# Patient Record
Sex: Male | Born: 1935 | Race: White | Hispanic: No | Marital: Married | Smoking: Never smoker
Health system: Southern US, Community
[De-identification: ages and names within clinical notes are randomized; demographics above are authoritative.]

## PROBLEM LIST (undated history)

## (undated) DIAGNOSIS — E079 Disorder of thyroid, unspecified: Secondary | ICD-10-CM

## (undated) DIAGNOSIS — F0390 Unspecified dementia without behavioral disturbance: Secondary | ICD-10-CM

---

## 2016-12-17 ENCOUNTER — Emergency Department (HOSPITAL_COMMUNITY)
Admission: EM | Admit: 2016-12-17 | Discharge: 2016-12-17 | Disposition: A | Discharge status: Home / Self Care | Payer: Medicare Other | Attending: Emergency Medicine | Admitting: Emergency Medicine

## 2016-12-17 DIAGNOSIS — R55 Syncope and collapse: Secondary | ICD-10-CM | POA: Insufficient documentation

## 2016-12-17 DIAGNOSIS — Z7982 Long term (current) use of aspirin: Secondary | ICD-10-CM | POA: Insufficient documentation

## 2016-12-17 LAB — BASIC METABOLIC PANEL
ANION GAP: 5 (ref 5–15)
BUN: 22 mg/dL — ABNORMAL HIGH (ref 6–20)
CHLORIDE: 108 mmol/L (ref 101–111)
CO2: 26 mmol/L (ref 22–32)
Calcium: 8.7 mg/dL — ABNORMAL LOW (ref 8.9–10.3)
Creatinine, Ser: 1.21 mg/dL (ref 0.61–1.24)
GFR calc Af Amer: >60 mL/min (ref >60)
GFR calc non Af Amer: 52 mL/min — ABNORMAL LOW (ref >60)
GLUCOSE: 87 mg/dL (ref 65–99)
POTASSIUM: 4.1 mmol/L (ref 3.5–5.1)
Sodium: 139 mmol/L (ref 135–145)

## 2016-12-17 LAB — CBC WITH DIFFERENTIAL/PLATELET
BASOS ABS: 0 10*3/uL (ref 0.0–0.1)
Basophils Relative: 0 %
Eosinophils Absolute: 0.1 10*3/uL (ref 0.0–0.7)
Eosinophils Relative: 1 %
HEMATOCRIT: 36.3 % — AB (ref 39.0–52.0)
Hemoglobin: 12.4 g/dL — ABNORMAL LOW (ref 13.0–17.0)
LYMPHS ABS: 2.3 10*3/uL (ref 0.7–4.0)
LYMPHS PCT: 19 %
MCH: 33 pg (ref 26.0–34.0)
MCHC: 34.2 g/dL (ref 30.0–36.0)
MCV: 96.5 fL (ref 78.0–100.0)
Monocytes Absolute: 0.7 10*3/uL (ref 0.1–1.0)
Monocytes Relative: 6 %
NEUTROS ABS: 8.6 10*3/uL — AB (ref 1.7–7.7)
Neutrophils Relative %: 74 %
Platelets: 152 10*3/uL (ref 150–400)
RBC: 3.76 MIL/uL — AB (ref 4.22–5.81)
RDW: 13.1 % (ref 11.5–15.5)
WBC: 11.8 10*3/uL — AB (ref 4.0–10.5)

## 2016-12-17 LAB — I-STAT TROPONIN, ED: Troponin i, poc: 0 ng/mL (ref 0.00–0.08)

## 2016-12-17 NOTE — ED Triage Notes (Addendum)
Pt arrived via [facility] from home after experiencing a near syncopal episode after showering. Pt was found by wife leaning against shower wall calling for help. Pt denies LOC in shower but experienced dizziness. Pt family stated pt passed out after they pulled pt from shower. Initial EMS BP was 86/42. NS was given on scene. Pt has hx of dementia. Pt alert but unable to tell RN what hospital he is at. Daughter and pt wife at bedside. CBG 205 on scene.

## 2016-12-17 NOTE — Discharge Instructions (Signed)
It was my pleasure taking care of you today!  Increase hydration.  Follow up with your primary care provider for further discussion of today's hospital visit.  Return to ER for new or worsening symptoms, any additional concerns.

## 2016-12-17 NOTE — ED Provider Notes (Signed)
[facility]-EMERGENCY DEPT Provider Note   CSN: [id number] Arrival date & time: 12/17/16  1320     History   Chief Complaint Chief Complaint  Patient presents with  . Near Syncope    HPI Ross Barron is a 83 y.o. male.  The history is provided by the patient, a relative, a caregiver and medical records. No language interpreter was used.  Near Syncope    Ross Barron is a 83 y.o. male  with a PMH of dementia and thyroid disorder who presents to the Emergency Department with wife and daughter for evaluation following syncopal episode just prior to arrival. Wife states that Ross Barron was taking a hot shower for quite some time, then called out for help. He was found in the shower, propped up against the wall. He looked weak and wife helped him to a seated position right outside the shower. She notes he was very pale and his legs were "shaking like they were going to give out". Upon getting seated, his head leaned forward and he "passed out", not responding for around 3-4 minutes, prompting them to call 911. After 3-4 minutes, patient returned to usual mental status. EMS arrived and initial BP was 86/42. 500 mL normal saline were given prior to arrival. No other medications prior to arrival for symptoms. BP upon arrival was 146/61. CBG of 205 by EMS. No change in medications recently. Wife does note that she has had 3-4 episodes that were quite similar, always after a long warm shower, over the last several months. She notes that today, he did not respond for several minutes, however typically, he will come to after 15-30 seconds. They have never sought medical care following these events. The daughter notes that they were out in the sun for a very long time yesterday and he didn't drink much water. She kept offering him water multiple times throughout the day, however he kept saying he was fine. No known cardiac PMH.   Level V caveat applies 2/2 dementia. He does not remember today's  episode and unable to provide much to history.   No past medical history on file.  There are no active problems to display for this patient.   No past surgical history on file.     Home Medications    Prior to Admission medications   Medication Sig Start Date End Date Taking? Authorizing Provider  aspirin EC 81 MG tablet Take 81 mg by mouth daily.   Yes [provider]  donepezil (ARICEPT) 10 MG tablet Take 10 mg by mouth at bedtime.   Yes [provider]  levothyroxine (SYNTHROID, LEVOTHROID) 50 MCG tablet Take 50 mcg by mouth daily before breakfast.   Yes [provider]  memantine (NAMENDA) 10 MG tablet Take 10 mg by mouth 2 (two) times daily.   Yes [provider]  Multiple Vitamin (MULTIVITAMIN WITH MINERALS) TABS tablet Take 1 tablet by mouth every morning.   Yes [provider]    Family History No family history on file.  Social History Social History  Substance Use Topics  . Smoking status: Not on file  . Smokeless tobacco: Not on file  . Alcohol use Not on file     Allergies   Patient has no known allergies.   Review of Systems Review of Systems  Unable to perform ROS: Dementia  Cardiovascular: Positive for near-syncope.  Neurological: Positive for syncope.     Physical Exam Updated Vital Signs BP (!) 144/72   Pulse Marland Kitchen(!)  56   Temp 97.5 F (36.4 C) (Oral)   Resp (!) 25   SpO2 98%   Physical Exam  Constitutional: He is oriented to person, place, and time. He appears well-developed and well-nourished. No distress.  HENT:  Head: Normocephalic and atraumatic.  Neck: No JVD present.  Cardiovascular: Normal heart sounds.   No murmur heard. Mildly bradycardic but regular.  Pulmonary/Chest: Effort normal and breath sounds normal. No respiratory distress. He has no wheezes. He has no rales.  Abdominal: Soft. He exhibits no distension. There is no tenderness.  Musculoskeletal: He exhibits no edema.    Neurological: He is alert and oriented to person, place, and time. No cranial nerve deficit.  Skin: Skin is warm and dry.  Nursing note and vitals reviewed.    ED Treatments / Results  Labs (all labs ordered are listed, but only abnormal results are displayed) Labs Reviewed  CBC WITH DIFFERENTIAL/PLATELET - Abnormal; Notable for the following:       Result Value   WBC 11.8 (*)    RBC 3.76 (*)    Hemoglobin 12.4 (*)    HCT 36.3 (*)    Neutro Abs 8.6 (*)    All other components within normal limits  BASIC METABOLIC PANEL - Abnormal; Notable for the following:    BUN 22 (*)    Calcium 8.7 (*)    GFR calc non Af Amer 52 (*)    All other components within normal limits  I-STAT TROPOININ, ED    EKG  EKG Interpretation  Date/Time:  Monday December 17 2016 15:26:47 EDT Ventricular Rate:  57 PR Interval:    QRS Duration: 93 QT Interval:  456 QTC Calculation: 444 R Axis:   21 Text Interpretation:  Sinus rhythm Abnormal R-wave progression, early transition No significant change since last tracing Confirmed by Doug Sou [phone]) on 12/17/2016 3:30:45 PM Also confirmed by Doug Sou [phone]), editor Misty Stanley [phone])  on 12/17/2016 3:40:04 PM       Radiology No results found.  Procedures Procedures (including critical care time)  Medications Ordered in ED Medications - No data to display   Initial Impression / Assessment and Plan / ED Course  I have reviewed the triage vital signs and the nursing notes.  Pertinent labs & imaging results that were available during my care of the patient were reviewed by me and considered in my medical decision making (see chart for details).    Ross Barron is a 83 y.o. male who presents to ED for syncopal event while in the shower just prior to arrival. Patient apparently outside in heat yesterday with little PO intake today. Near syncope while taking a warm shower and had witnessed syncopal event where head fell  forward right after being sat down. No seizure-like activity or hx of seizures. Hypotensive prior to arrival 86/42 - 500cc bolus given and BP 146/61 upon arrival.  Upon initial evaluation, patient is afebrile, hemodynamically stable with no complaints. Family state he appears at his baseline health currently. Labs reviewed and reassuring. Troponin negative. EKG non-ischemic. Evaluation does not show pathology that would require ongoing emergent intervention or inpatient treatment. Patient's family understands importance of following up with PCP for further discussion of today's incident and recurring near-syncopal events. Reasons to return to ER were discussed and all questions answered.   Patient seen by and discussed with Dr. Verdie Mosher who agrees with treatment plan.    Final Clinical Impressions(s) / ED Diagnoses   Final diagnoses:  Syncope, unspecified  syncope type    New Prescriptions New Prescriptions   No medications on file     Diamantina Edinger, Chase Picket, PA-C 12/17/16 1710    Lavera Guise, MD 12/17/16 [phone]

## 2016-12-17 NOTE — ED Provider Notes (Signed)
Medical screening examination/treatment/procedure(s) were conducted as a shared visit with non-physician practitioner(s) and myself.  I personally evaluated the patient during the encounter.   EKG Interpretation  Date/Time:  Monday December 17 2016 15:26:47 EDT Ventricular Rate:  57 PR Interval:    QRS Duration: 93 QT Interval:  456 QTC Calculation: 444 R Axis:   21 Text Interpretation:  Sinus rhythm Abnormal R-wave progression, early transition No significant change since last tracing Confirmed by Doug SouJacubowitz, Sam [phone]([id number]) on 12/17/2016 3:30:45 PM Also confirmed by Doug SouJacubowitz, Sam [phone]), editor Misty StanleyScales-Price, Shannon [phone])  on 12/17/2016 3:40:6804 PM      83 year old male with history of dementia who presents with syncope. Patient taking hot shower, when he felt unwell and called out to wife. Family sat him down on side of bathtub and he became unresponsive for 3 minutes before waking and returning to normal mental status. He has had syncope before, all in the setting of taking prolonged hot showers. Denies associated chest pain, back pain, abdominal pain, n/v/d, Gi bleeding, fever or chills. Was orthostatic for EMS and given 500 cc IVF. Feels normal in ED.   Patient with normotension and stable vital signs. Ekg unchanged from prior and without acute stigmata of arrhythmia. Blood work stable, with stable mild anemia 12.4. No major electrolyte or metabolic derangements. Suspect that given only occurring with hot showers, that he vasodilates, has pooling of blood and potential orthostasic hypotension and syncope. Given age discussed potential observation in hospital, but patient and family expressed desire for discharge home with close PCP follow-up. They state he has had work-up with ECHO and with cardiology follow-up in the past that have been reassuring. Discussed strict return and follow-up instructions. Patient and family expressed understanding of all discharge instructions and felt comfortable with  plan of care.   Lavera GuiseLiu, Marlyce Mcdougald Duo, MD 12/17/16 2245

## 2017-08-05 ENCOUNTER — Ambulatory Visit (INDEPENDENT_AMBULATORY_CARE_PROVIDER_SITE_OTHER): Payer: Non-veteran care | Admitting: Podiatry

## 2017-08-05 ENCOUNTER — Encounter: Payer: Self-pay | Admitting: Podiatry

## 2017-08-05 VITALS — BP 142/59 | HR 56

## 2017-08-05 DIAGNOSIS — B351 Tinea unguium: Secondary | ICD-10-CM | POA: Diagnosis not present

## 2017-08-05 DIAGNOSIS — M79672 Pain in left foot: Secondary | ICD-10-CM

## 2017-08-05 DIAGNOSIS — M79671 Pain in right foot: Secondary | ICD-10-CM | POA: Diagnosis not present

## 2017-08-05 NOTE — Progress Notes (Signed)
SUBJECTIVE: 84 y.o. year old male presents requesting toe nails trimmed. Having difficulty trimming nails. They are too thick and painful. Patient was accompanied by his wife. Patient is hard of hearing.   Review of Systems  Constitutional: Negative.   HENT: Negative for ear discharge, ear pain and tinnitus.        Difficulty hearing with hearing aid on.   Eyes: Negative.   Respiratory: Negative.   Cardiovascular: Negative.   Genitourinary: Negative.   Musculoskeletal: Negative.   Skin: Negative.      OBJECTIVE: DERMATOLOGIC EXAMINATION: Thick yellow dystrophic nails x 10.  VASCULAR EXAMINATION OF LOWER LIMBS: Pedal pulses are not palpable on DP and palpable on PT bilateral. Capillary Filling times within 3 seconds in all digits.  No edema or erythema noted. Temperature gradient from tibial crest to dorsum of foot is within normal bilateral.  NEUROLOGIC EXAMINATION OF THE LOWER LIMBS: Achilles DTR is present and within normal. Monofilament (Semmes-Weinstein 10-gm) sensory testing positive 6 out of 6, bilateral. Vibratory sensations(128Hz  turning fork) intact at medial and lateral forefoot bilateral.  Sharp and Dull discriminatory sensations at the plantar ball of hallux is intact bilateral.   MUSCULOSKELETAL EXAMINATION: No gross deformities.   ASSESSMENT: Onychomycosis x 10. Painful nails.  PLAN: Reviewed findings and available treatment options. All nails debrided. May return in 3 months.

## 2017-08-05 NOTE — Patient Instructions (Signed)
Seen for hypertrophic nails. All nails debrided. Return in 3 months or sooner if needed.  

## 2017-10-12 ENCOUNTER — Encounter (HOSPITAL_COMMUNITY): Payer: Self-pay

## 2017-10-12 ENCOUNTER — Emergency Department (HOSPITAL_COMMUNITY)
Admission: EM | Admit: 2017-10-12 | Discharge: 2017-10-12 | Disposition: A | Discharge status: Home / Self Care | Payer: Medicare Other | Attending: Emergency Medicine | Admitting: Emergency Medicine

## 2017-10-12 ENCOUNTER — Emergency Department (HOSPITAL_COMMUNITY): Payer: Medicare Other

## 2017-10-12 DIAGNOSIS — R531 Weakness: Secondary | ICD-10-CM | POA: Diagnosis not present

## 2017-10-12 DIAGNOSIS — Z79899 Other long term (current) drug therapy: Secondary | ICD-10-CM | POA: Insufficient documentation

## 2017-10-12 DIAGNOSIS — R55 Syncope and collapse: Secondary | ICD-10-CM | POA: Diagnosis not present

## 2017-10-12 DIAGNOSIS — Z7982 Long term (current) use of aspirin: Secondary | ICD-10-CM | POA: Insufficient documentation

## 2017-10-12 DIAGNOSIS — F0390 Unspecified dementia without behavioral disturbance: Secondary | ICD-10-CM | POA: Diagnosis not present

## 2017-10-12 DIAGNOSIS — R001 Bradycardia, unspecified: Secondary | ICD-10-CM | POA: Diagnosis not present

## 2017-10-12 HISTORY — DX: Disorder of thyroid, unspecified: E07.9

## 2017-10-12 HISTORY — DX: Unspecified dementia, unspecified severity, without behavioral disturbance, psychotic disturbance, mood disturbance, and anxiety: F03.90

## 2017-10-12 LAB — BASIC METABOLIC PANEL
ANION GAP: 10 (ref 5–15)
BUN: 27 mg/dL — ABNORMAL HIGH (ref 6–20)
CALCIUM: 9.2 mg/dL (ref 8.9–10.3)
CO2: 23 mmol/L (ref 22–32)
Chloride: 107 mmol/L (ref 101–111)
Creatinine, Ser: 1.15 mg/dL (ref 0.61–1.24)
GFR, EST NON AFRICAN AMERICAN: 55 mL/min — AB (ref >60)
Glucose, Bld: 92 mg/dL (ref 65–99)
Potassium: 4.1 mmol/L (ref 3.5–5.1)
SODIUM: 140 mmol/L (ref 135–145)

## 2017-10-12 LAB — CBC
HCT: 40.4 % (ref 39.0–52.0)
HEMOGLOBIN: 13.5 g/dL (ref 13.0–17.0)
MCH: 32.5 pg (ref 26.0–34.0)
MCHC: 33.4 g/dL (ref 30.0–36.0)
MCV: 97.1 fL (ref 78.0–100.0)
Platelets: 152 10*3/uL (ref 150–400)
RBC: 4.16 MIL/uL — AB (ref 4.22–5.81)
RDW: 13.5 % (ref 11.5–15.5)
WBC: 8.9 10*3/uL (ref 4.0–10.5)

## 2017-10-12 MED ORDER — SODIUM CHLORIDE 0.9 % IV BOLUS
500.0000 mL | Freq: Once | INTRAVENOUS | Status: AC
  Administered 2017-10-12: 500 mL via INTRAVENOUS

## 2017-10-12 NOTE — ED Provider Notes (Signed)
[facility]   CSN: [id number] Arrival date & time: 10/12/17  0134     History   Chief Complaint Chief Complaint  Patient presents with  . Weakness    HPI Ross Barron is a 84 y.o. male.  The history is provided by the patient and medical records.  Weakness      84 year old male with history of dementia and thyroid disease, presenting to the ED with generalized weakness.  Wife states he has been complaining of feeling "terrible" all day.  States got worse this evening around 5 PM.  States he was sitting at the table and stated "I am going to die".  Wife states he has not really had any significant symptoms other than a small amount of diarrhea a few days ago.  He has not had any nausea or vomiting.  His appetite has been poor and he refuses to drink fluids.  It apparently has been an ongoing battle trying to get him to drink fluids during the day.  He denies any chest pain, SOB, abdominal pain, nausea, vomiting.  States he feels weak all over.  No focal numbness/weakness of the arms or legs.  States he does feel lightheaded when he stands up sometimes.  He does not use cane or walker.  Family reports hx of syncopal events in the past secondary to similar symptoms.  No syncopal events or falls in the past 24 hours.  Wife reports they did increase his aricept from 10mg  to 15mg  recently, no other medication changes.  Past Medical History:  Diagnosis Date  . Dementia   . Thyroid disease     There are no active problems to display for this patient.   History reviewed. No pertinent surgical history.      Home Medications    Prior to Admission medications   Medication Sig Start Date End Date Taking? Authorizing Provider  aspirin EC 81 MG tablet Take 81 mg by mouth daily.    [provider]  donepezil (ARICEPT) 10 MG tablet Take 10 mg by mouth at bedtime.    [provider]  levothyroxine (SYNTHROID,  LEVOTHROID) 50 MCG tablet Take 50 mcg by mouth daily before breakfast.    [provider]  memantine (NAMENDA) 10 MG tablet Take 10 mg by mouth 2 (two) times daily.    [provider]  Multiple Vitamin (MULTIVITAMIN WITH MINERALS) TABS tablet Take 1 tablet by mouth every morning.    [provider]    Family History No family history on file.  Social History Social History   Tobacco Use  . Smoking status: Never Smoker  . Smokeless tobacco: Never Used  Substance Use Topics  . Alcohol use: Not on file  . Drug use: Not on file     Allergies   Patient has no known allergies.   Review of Systems Review of Systems  Neurological: Positive for weakness (generalized).  All other systems reviewed and are negative.    Physical Exam Updated Vital Signs BP (!) 148/65   Pulse (!) 51   Temp 98.2 F (36.8 C) (Rectal)   Resp (!) 23   Ht 5\' 3"  (1.6 m)   Wt 49.9 kg (110 lb)   SpO2 97%   BMI 19.49 kg/m   Physical Exam  Constitutional: He is oriented to person, place, and time. He appears well-developed and well-nourished.  Elderly, thin but not cachectic, NAD  HENT:  Head: Normocephalic and atraumatic.  Mouth/Throat:  Oropharynx is clear and moist.  Dry mucous membranes  Eyes: Pupils are equal, round, and reactive to light. Conjunctivae and EOM are normal.  Neck: Normal range of motion.  Cardiovascular: Normal rate, regular rhythm and normal heart sounds.  Pulmonary/Chest: Effort normal and breath sounds normal.  Abdominal: Soft. Bowel sounds are normal.  Musculoskeletal: Normal range of motion.  Neurological: He is alert and oriented to person, place, and time.  AAOx3, answering questions and following commands appropriately; equal strength UE and LE bilaterally; CN grossly intact; moves all extremities appropriately without ataxia; no focal neuro deficits or facial asymmetry appreciated  Skin: Skin is warm and dry.  Psychiatric: He has a normal mood  and affect.  Nursing note and vitals reviewed.    ED Treatments / Results  Labs (all labs ordered are listed, but only abnormal results are displayed) Labs Reviewed  BASIC METABOLIC PANEL - Abnormal; Notable for the following components:      Result Value   BUN 27 (*)    GFR calc non Af Amer 55 (*)    All other components within normal limits  CBC - Abnormal; Notable for the following components:   RBC 4.16 (*)    All other components within normal limits  URINALYSIS, ROUTINE W REFLEX MICROSCOPIC  CBG MONITORING, ED    EKG EKG Interpretation  Date/Time:  Saturday October 12 2017 01:40:35 EDT Ventricular Rate:  57 PR Interval:    QRS Duration: 95 QT Interval:  482 QTC Calculation: 470 R Axis:   51 Text Interpretation:  Sinus rhythm Abnormal R-wave progression, early transition No acute changes Nonspecific ST and T wave abnormality Confirmed by Derwood Kaplan ([id number]) on 10/12/2017 4:50:58 AM   Radiology Dg Chest 2 View  Result Date: 10/12/2017 CLINICAL DATA:  84 year old male with generalized weakness. EXAM: CHEST - 2 VIEW COMPARISON:  None. FINDINGS: Shallow inspiration with minimal bibasilar atelectasis. Right apical pleural thickening/scarring. Calcified pleural plaques. No focal consolidation, pleural effusion, or pneumothorax. The cardiac silhouette is within normal limits. No acute osseous pathology. IMPRESSION: No active cardiopulmonary disease. Electronically Signed   By: Elgie Collard M.D.   On: 10/12/2017 04:03    Procedures Procedures (including critical care time)  Medications Ordered in ED Medications  sodium chloride 0.9 % bolus 500 mL (0 mLs Intravenous Stopped 10/12/17 0519)     Initial Impression / Assessment and Plan / ED Course  I have reviewed the triage vital signs and the nursing notes.  Pertinent labs & imaging results that were available during my care of the patient were reviewed by me and considered in my medical decision making (see chart  for details).  84 year old male presenting to the ED with generalized weakness.  This began later this evening.  Patient was apparently sitting at the table with wife stating "he felt terrible" and that "he might die".  He has not had any chest pain, shortness of breath, dizziness, numbness, focal weakness, abdominal pain, nausea, or vomiting.  Wife states he did have some diarrhea a few days ago.  He is awake, alert, appropriately oriented here.  His vital signs are stable, heart rate is on the lower side.  No focal neurologic deficits.  Still denies any pain.  EKG is nonischemic.  Screening labs overall reassuring--BUN is elevated at 27.  Family reports patient has ongoing issue with drinking fluids at home as most of the time he refuses.  Suspect some of his symptoms may be related to dehydration.  He will given small  fluid bolus.  Will add on urinalysis and chest x-ray.    Chest x-ray is negative.  Patient feeling better after IV fluids.  He did drink some water here as well.  Family is happy with his improvement.  Still awaiting UA, however family elected to take patient home.  No hx of UTI, no current urinary symptoms.  Feel this is reasonable.  They will follow-up closely with primary care doctor.  Discussed continued oral hydration at home.  They understand to return here for any new/acute changes.    Final Clinical Impressions(s) / ED Diagnoses   Final diagnoses:  Generalized weakness  Vasovagal attack  Bradycardia    ED Discharge Orders    None       Garlon Hatchet, PA-C 10/12/17 0600    Derwood Kaplan, MD 10/15/17 2142

## 2017-10-12 NOTE — ED Notes (Signed)
ED Provider at bedside. 

## 2017-10-12 NOTE — Discharge Instructions (Addendum)
All the results in the ER are normal, labs and imaging. Our monitoring revealed that you have been having heart rate in the 40s, otherwise no abnormalities detected. We are not sure what is causing your symptoms.  We suspect that this could be orthostasis, and recommend that you drink appropriate amount of fluids.  The workup in the ER is not complete, and is limited to screening for life threatening and emergent conditions only, so please see a primary care doctor for further evaluation.  Please keep a close eye on how Ross Barron is doing over the next couple of days. If he starts developing any symptoms of infection or is getting worse please return to the ER immediately.

## 2017-10-12 NOTE — ED Triage Notes (Signed)
Pt from home c/o generalized weakness and decreased appetite that has been going on all day. Pt has hx of dementia. VSS, nad. Pt alert. CBG 116

## 2017-11-04 ENCOUNTER — Ambulatory Visit (INDEPENDENT_AMBULATORY_CARE_PROVIDER_SITE_OTHER): Payer: Non-veteran care | Admitting: Podiatry

## 2017-11-04 ENCOUNTER — Encounter: Payer: Self-pay | Admitting: Podiatry

## 2017-11-04 DIAGNOSIS — M79672 Pain in left foot: Secondary | ICD-10-CM

## 2017-11-04 DIAGNOSIS — B351 Tinea unguium: Secondary | ICD-10-CM

## 2017-11-04 DIAGNOSIS — M79671 Pain in right foot: Secondary | ICD-10-CM | POA: Diagnosis not present

## 2017-11-04 NOTE — Patient Instructions (Signed)
Seen for hypertrophic nails. All nails debrided. Return in 3 months or as needed.  

## 2017-11-04 NOTE — Progress Notes (Signed)
Subjective: 84 y.o. year old male patient accompanied by his wife presents complaining of painful nails. Patient requests toe nails trimmed.   Objective: Dermatologic: Thick yellow deformed nails with fungal debris under nail plate x 10. Vascular: Pedal pulses are all palpable. Orthopedic: Contracted lesser digits with bunion deformities. Neurologic: All epicritic and tactile sensations grossly intact.  Assessment: Dystrophic mycotic nails x 10. Painful feet.  Treatment: All mycotic nails debrided.  Return in 3 months or as needed.

## 2018-02-03 ENCOUNTER — Ambulatory Visit: Payer: Self-pay | Admitting: Podiatry

## 2020-02-05 IMAGING — CR DG CHEST 2V
2 series · 2 of 2 positions shown · non-contrast
Comparison: None.

CLINICAL DATA: 89-year-old male with generalized weakness.

EXAM:
CHEST - 2 VIEW

[chest lat]
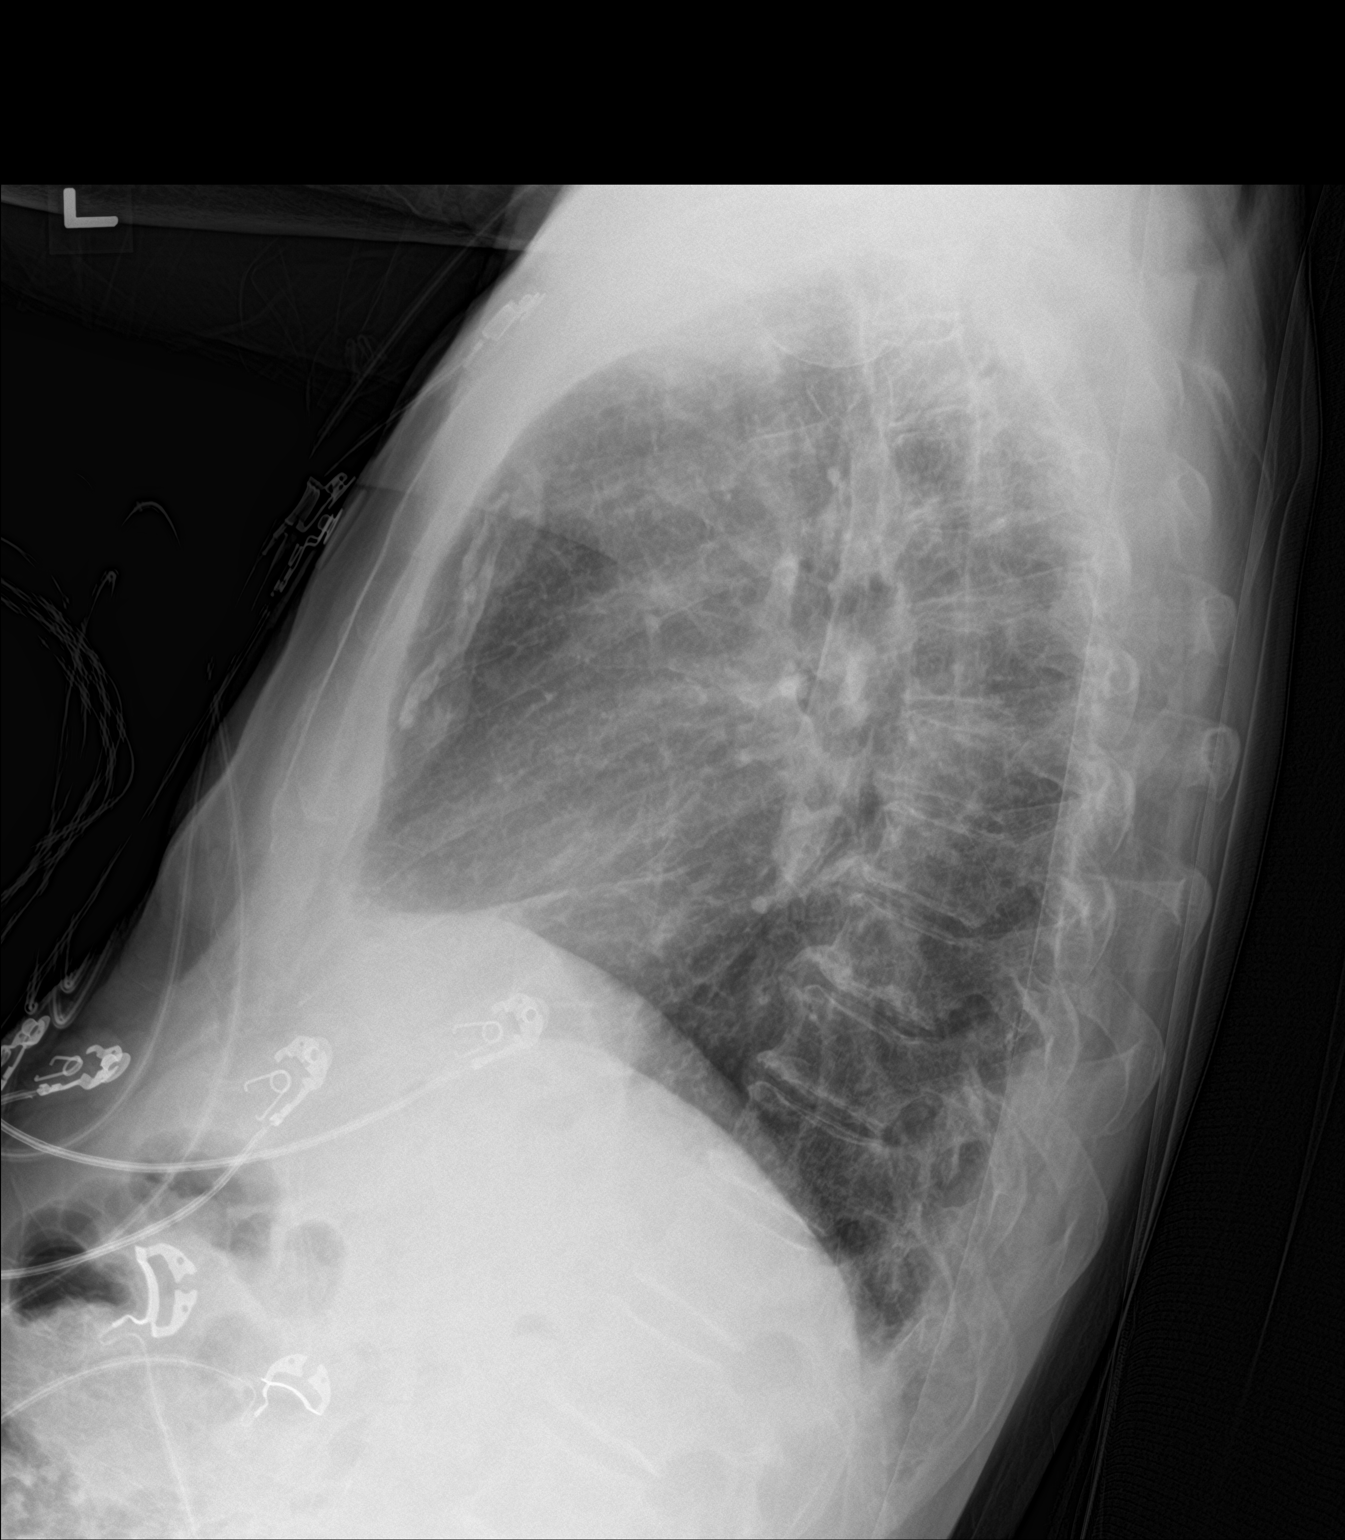

[chest ap]
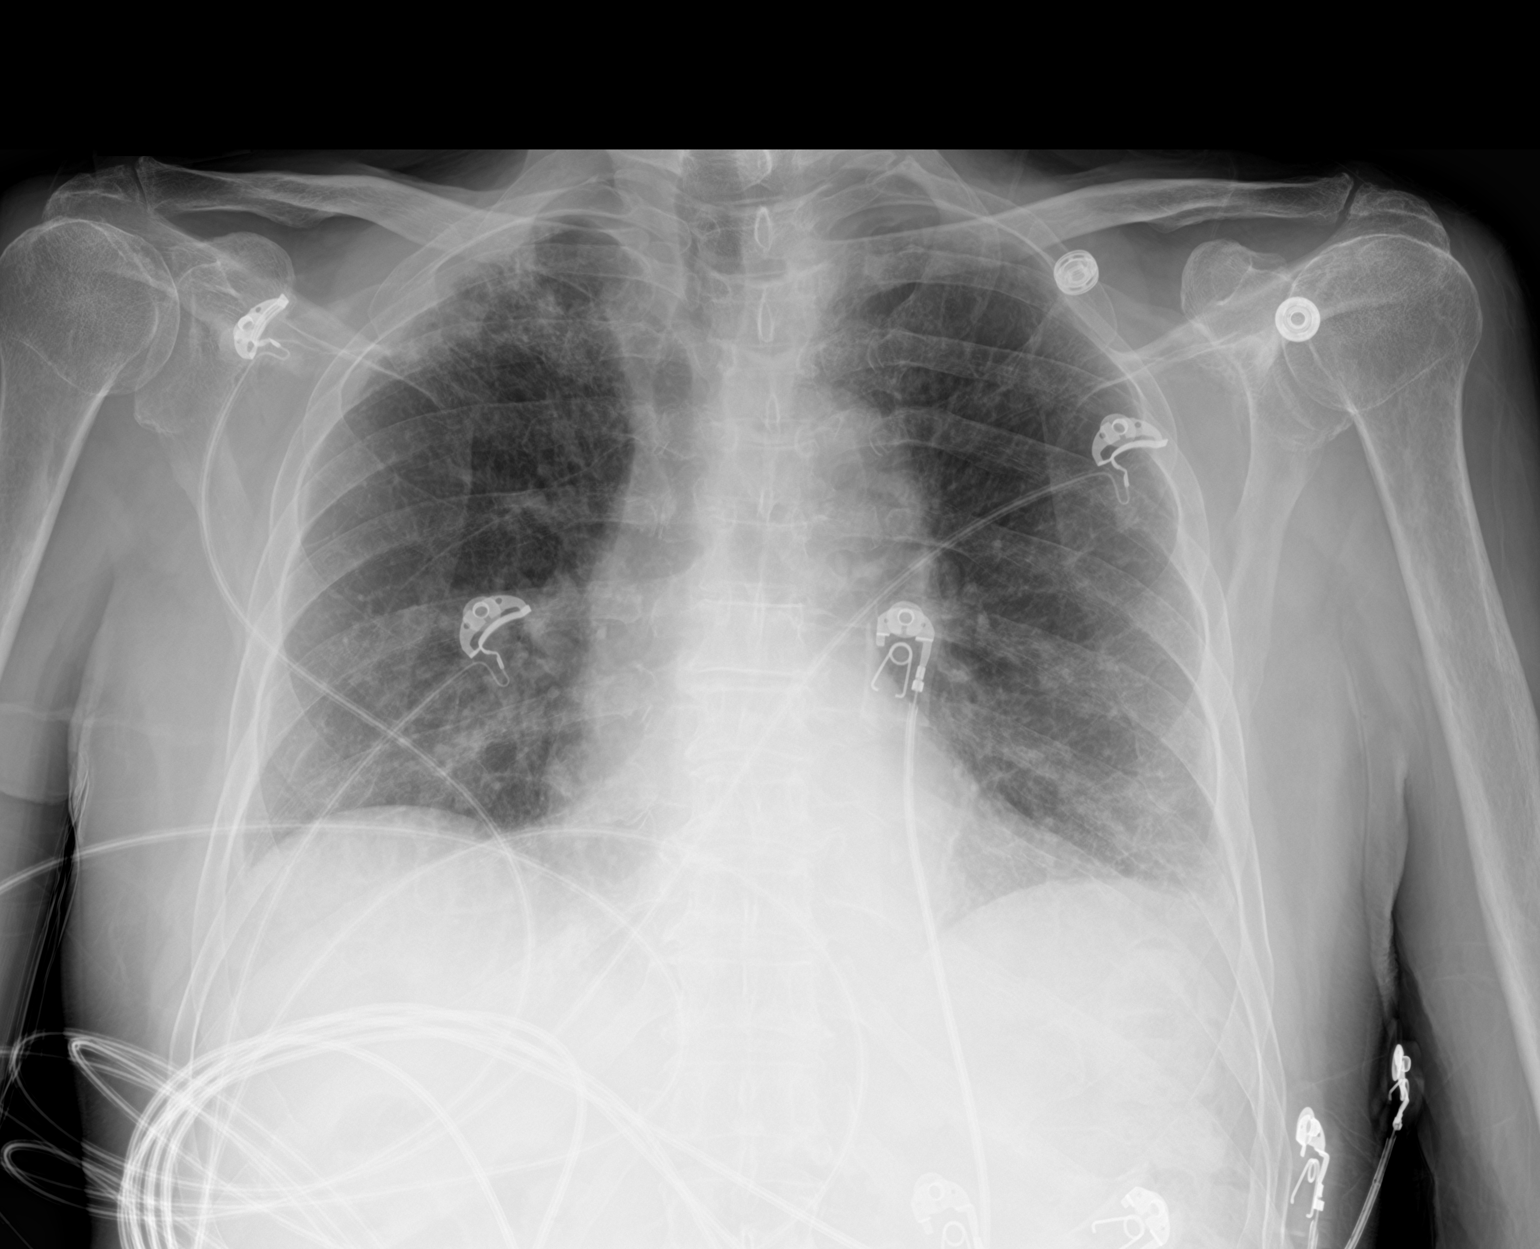

[2 of 2 positions shown; findings below may reference images not displayed]

FINDINGS: Shallow inspiration with minimal bibasilar atelectasis. Right apical
pleural thickening/scarring. Calcified pleural plaques. No focal
consolidation, pleural effusion, or pneumothorax. The cardiac
silhouette is within normal limits. No acute osseous pathology.
IMPRESSION: No active cardiopulmonary disease.

## 2022-07-06 ENCOUNTER — Emergency Department (HOSPITAL_COMMUNITY)
Admission: EM | Admit: 2022-07-06 | Discharge: 2022-07-06 | Disposition: A | Discharge status: Home / Self Care | Payer: Medicare Other | Attending: Emergency Medicine | Admitting: Emergency Medicine

## 2022-07-06 ENCOUNTER — Other Ambulatory Visit: Payer: Self-pay

## 2022-07-06 ENCOUNTER — Emergency Department (HOSPITAL_COMMUNITY): Payer: Medicare Other

## 2022-07-06 DIAGNOSIS — Z1152 Encounter for screening for COVID-19: Secondary | ICD-10-CM | POA: Insufficient documentation

## 2022-07-06 DIAGNOSIS — Y9301 Activity, walking, marching and hiking: Secondary | ICD-10-CM | POA: Diagnosis not present

## 2022-07-06 DIAGNOSIS — E860 Dehydration: Secondary | ICD-10-CM | POA: Insufficient documentation

## 2022-07-06 DIAGNOSIS — W19XXXA Unspecified fall, initial encounter: Secondary | ICD-10-CM | POA: Diagnosis not present

## 2022-07-06 DIAGNOSIS — R051 Acute cough: Secondary | ICD-10-CM | POA: Insufficient documentation

## 2022-07-06 DIAGNOSIS — F0390 Unspecified dementia without behavioral disturbance: Secondary | ICD-10-CM | POA: Insufficient documentation

## 2022-07-06 DIAGNOSIS — R55 Syncope and collapse: Secondary | ICD-10-CM | POA: Diagnosis present

## 2022-07-06 LAB — RESP PANEL BY RT-PCR (RSV, FLU A&B, COVID)  RVPGX2
Influenza A by PCR: NEGATIVE
Influenza B by PCR: NEGATIVE
Resp Syncytial Virus by PCR: NEGATIVE
SARS Coronavirus 2 by RT PCR: NEGATIVE

## 2022-07-06 LAB — CBC
HCT: 32.4 % — ABNORMAL LOW (ref 39.0–52.0)
Hemoglobin: 10.8 g/dL — ABNORMAL LOW (ref 13.0–17.0)
MCH: 33.3 pg (ref 26.0–34.0)
MCHC: 33.3 g/dL (ref 30.0–36.0)
MCV: 100 fL (ref 80.0–100.0)
Platelets: 145 10*3/uL — ABNORMAL LOW (ref 150–400)
RBC: 3.24 MIL/uL — ABNORMAL LOW (ref 4.22–5.81)
RDW: 12.9 % (ref 11.5–15.5)
WBC: 13.5 10*3/uL — ABNORMAL HIGH (ref 4.0–10.5)
nRBC: 0 % (ref 0.0–0.2)

## 2022-07-06 LAB — COMPREHENSIVE METABOLIC PANEL
ALT: 12 U/L (ref 0–44)
AST: 17 U/L (ref 15–41)
Albumin: 3.4 g/dL — ABNORMAL LOW (ref 3.5–5.0)
Alkaline Phosphatase: 84 U/L (ref 38–126)
Anion gap: 7 (ref 5–15)
BUN: 28 mg/dL — ABNORMAL HIGH (ref 8–23)
CO2: 22 mmol/L (ref 22–32)
Calcium: 8.5 mg/dL — ABNORMAL LOW (ref 8.9–10.3)
Chloride: 105 mmol/L (ref 98–111)
Creatinine, Ser: 1.25 mg/dL — ABNORMAL HIGH (ref 0.61–1.24)
GFR, Estimated: 54 mL/min — ABNORMAL LOW (ref >60)
Glucose, Bld: 131 mg/dL — ABNORMAL HIGH (ref 70–99)
Potassium: 4 mmol/L (ref 3.5–5.1)
Sodium: 134 mmol/L — ABNORMAL LOW (ref 135–145)
Total Bilirubin: 0.5 mg/dL (ref 0.3–1.2)
Total Protein: 6.3 g/dL — ABNORMAL LOW (ref 6.5–8.1)

## 2022-07-06 LAB — PROTIME-INR
INR: 1.1 (ref 0.8–1.2)
Prothrombin Time: 14.3 seconds (ref 11.4–15.2)

## 2022-07-06 MED ORDER — SODIUM CHLORIDE 0.9 % IV BOLUS
1000.0000 mL | Freq: Once | INTRAVENOUS | Status: AC
  Administered 2022-07-06: 1000 mL via INTRAVENOUS

## 2022-07-06 NOTE — ED Provider Notes (Signed)
[facility]Y DEPT [facility] Emergency Department Provider Note MRN:  [id number]  Arrival date & time: 07/06/22     Chief Complaint   Loss of Consciousness   History of Present Illness   Ross Barron is a 88 y.o. year-old male with a history of dementia presenting to the ED with chief complaint of loss of consciousness.  Patient got out of bed and was walking to the bathroom and then he fell.  Family and patient are unsure if he passed out or if he simply fell.  Patient currently is without complaints, denies pain.  No signs of head trauma.  Has had a persistent cough for the past 2 days, otherwise no symptoms.  Review of Systems  A thorough review of systems was obtained and all systems are negative except as noted in the HPI and PMH.   Patient's Health History    Past Medical History:  Diagnosis Date   Dementia (HCC)    Thyroid disease     No past surgical history on file.  No family history on file.  Social History   Socioeconomic History   Marital status: Married    Spouse name: Not on file   Number of children: Not on file   Years of education: Not on file   Highest education level: Not on file  Occupational History   Not on file  Tobacco Use   Smoking status: Never   Smokeless tobacco: Never  Substance and Sexual Activity   Alcohol use: Not on file   Drug use: Not on file   Sexual activity: Not on file  Other Topics Concern   Not on file  Social History Narrative   Not on file   Social Determinants of Health   Financial Resource Strain: Not on file  Food Insecurity: Not on file  Transportation Needs: Not on file  Physical Activity: Not on file  Stress: Not on file  Social Connections: Not on file  Intimate Partner Violence: Not on file     Physical Exam   Vitals:   07/06/22 0400 07/06/22 0500  BP: 136/79 101/87  Pulse: 64 64  Resp: 17 (!) 22  Temp:    SpO2: 96% 97%    CONSTITUTIONAL: Chronically ill-appearing,  NAD NEURO/PSYCH: Alert, oriented to name only, moves all extremities EYES:  eyes equal and reactive ENT/NECK:  no LAD, no JVD CARDIO: Regular rate, well-perfused, normal S1 and S2 PULM:  CTAB no wheezing or rhonchi GI/GU:  non-distended, non-tender MSK/SPINE:  No gross deformities, no edema SKIN:  no rash, atraumatic   *Additional and/or pertinent findings included in MDM below  Diagnostic and Interventional Summary    EKG Interpretation  Date/Time:  Friday July 06 2022 02:56:49 EST Ventricular Rate:  64 PR Interval:    QRS Duration: 96 QT Interval:  436 QTC Calculation: 450 R Axis:   2 Text Interpretation: Jsinus rhythm Abnormal R-wave progression, early transition Baseline wander in lead(s) V6 Confirmed by Kennis Carina [phone]) on 07/06/2022 4:58:38 AM       Labs Reviewed  CBC - Abnormal; Notable for the following components:      Result Value   WBC 13.5 (*)    RBC 3.24 (*)    Hemoglobin 10.8 (*)    HCT 32.4 (*)    Platelets 145 (*)    All other components within normal limits  COMPREHENSIVE METABOLIC PANEL - Abnormal; Notable for the following components:   Sodium 134 (*)    Glucose, Bld 131 (*)  BUN 28 (*)    Creatinine, Ser 1.25 (*)    Calcium 8.5 (*)    Total Protein 6.3 (*)    Albumin 3.4 (*)    GFR, Estimated 54 (*)    All other components within normal limits  RESP PANEL BY RT-PCR (RSV, FLU A&B, COVID)  [id number]  PROTIME-INR    DG Chest Port 1 View  Final Result      Medications  sodium chloride 0.9 % bolus 1,000 mL (1,000 mLs Intravenous New Bag/Given 07/06/22 0507)     Procedures  /  Critical Care Procedures  ED Course and Medical Decision Making  Initial Impression and Ddx Patient is well-appearing without complaints, normal vital signs, range of motion of the neck, no signs of head trauma, no tenderness to the chest or abdomen, no spinal tenderness, normal range of motion of the legs, overall very reassuring trauma exam.  Suspect either  a mechanical fall or a near syncopal episode from orthostatic hypotension given history.  Obtaining screening lab, EKG, chest x-ray given the cough.  Past medical/surgical history that increases complexity of ED encounter: Dementia  Interpretation of Diagnostics I personally reviewed the EKG and my interpretation is as follows: Sinus rhythm  Labs reassuring with no significant blood count or electrolyte disturbance, minimal elevation in creatinine  Patient Reassessment and Ultimate Disposition/Management     Family concerned about dehydration, providing liter of fluids, will swab for COVID, flu, RSV given the recent cough but no evidence of pneumonia on chest x-ray, appropriate for discharge.  Patient management required discussion with the following services or consulting groups:  None  Complexity of Problems Addressed Acute illness or injury that poses threat of life of bodily function  Additional Data Reviewed and Analyzed Further history obtained from: Further history from spouse/family member  Additional Factors Impacting ED Encounter Risk None  Elmer Sow. Pilar Plate, MD [facility] Emergency Medicine [facility] [email or web address]  Final Clinical Impressions(s) / ED Diagnoses     ICD-10-CM   1. Acute cough  R05.1     2. Fall, initial encounter  W19.XXXA     3. Dehydration  E86.0       ED Discharge Orders     None        Discharge Instructions Discussed with and Provided to Patient:     Discharge Instructions      You were evaluated in the Emergency Department and after careful evaluation, we did not find any emergent condition requiring admission or further testing in the hospital.  Your exam/testing today is overall reassuring.  Symptoms may be related to dehydration.  Recommend further hydration at home, follow-up in her COVID and flu testing on MyChart.  Please return to the Emergency Department if you experience any worsening of your  condition.   Thank you for allowing [place] to be a part of your care.       Sabas Sous, MD 07/06/22 [phone]

## 2022-07-06 NOTE — ED Triage Notes (Signed)
Pt BIB [facility] from [facility]. Reports syncope on the way to the bathroom tonight. Got up to the shower chair and passed out again and had an episode of diarrhea. Orthostatic initially with EMS. Daughter is positive for COVID.

## 2022-07-06 NOTE — Discharge Instructions (Signed)
You were evaluated in the Emergency Department and after careful evaluation, we did not find any emergent condition requiring admission or further testing in the hospital.  Your exam/testing today is overall reassuring.  Symptoms may be related to dehydration.  Recommend further hydration at home, follow-up in her COVID and flu testing on MyChart.  Please return to the Emergency Department if you experience any worsening of your condition.   Thank you for allowing [place] to be a part of your care.

## 2023-05-28 ENCOUNTER — Emergency Department (HOSPITAL_COMMUNITY)
Admission: EM | Admit: 2023-05-28 | Discharge: 2023-05-28 | Disposition: A | Discharge status: Home / Self Care | Payer: No Typology Code available for payment source | Attending: Emergency Medicine | Admitting: Emergency Medicine

## 2023-05-28 ENCOUNTER — Other Ambulatory Visit: Payer: Self-pay

## 2023-05-28 ENCOUNTER — Emergency Department (HOSPITAL_COMMUNITY): Payer: No Typology Code available for payment source

## 2023-05-28 DIAGNOSIS — R4182 Altered mental status, unspecified: Secondary | ICD-10-CM | POA: Diagnosis present

## 2023-05-28 DIAGNOSIS — F0390 Unspecified dementia without behavioral disturbance: Secondary | ICD-10-CM | POA: Insufficient documentation

## 2023-05-28 DIAGNOSIS — R319 Hematuria, unspecified: Secondary | ICD-10-CM | POA: Insufficient documentation

## 2023-05-28 DIAGNOSIS — R531 Weakness: Secondary | ICD-10-CM | POA: Diagnosis not present

## 2023-05-28 LAB — CBC WITH DIFFERENTIAL/PLATELET
Abs Immature Granulocytes: 0.07 10*3/uL (ref 0.00–0.07)
Basophils Absolute: 0.1 10*3/uL (ref 0.0–0.1)
Basophils Relative: 1 %
Eosinophils Absolute: 0.3 10*3/uL (ref 0.0–0.5)
Eosinophils Relative: 3 %
HCT: 39.2 % (ref 39.0–52.0)
Hemoglobin: 13.3 g/dL (ref 13.0–17.0)
Immature Granulocytes: 1 %
Lymphocytes Relative: 19 %
Lymphs Abs: 1.8 10*3/uL (ref 0.7–4.0)
MCH: 33.6 pg (ref 26.0–34.0)
MCHC: 33.9 g/dL (ref 30.0–36.0)
MCV: 99 fL (ref 80.0–100.0)
Monocytes Absolute: 0.8 10*3/uL (ref 0.1–1.0)
Monocytes Relative: 9 %
Neutro Abs: 6.5 10*3/uL (ref 1.7–7.7)
Neutrophils Relative %: 67 %
Platelets: 187 10*3/uL (ref 150–400)
RBC: 3.96 MIL/uL — ABNORMAL LOW (ref 4.22–5.81)
RDW: 13 % (ref 11.5–15.5)
WBC: 9.5 10*3/uL (ref 4.0–10.5)
nRBC: 0 % (ref 0.0–0.2)

## 2023-05-28 LAB — URINALYSIS, W/ REFLEX TO CULTURE (INFECTION SUSPECTED)
Bilirubin Urine: NEGATIVE
Glucose, UA: NEGATIVE mg/dL
Ketones, ur: NEGATIVE mg/dL
Leukocytes,Ua: NEGATIVE
Nitrite: NEGATIVE
Protein, ur: NEGATIVE mg/dL
RBC / HPF: >50 RBC/hpf (ref 0–5)
Specific Gravity, Urine: 1.012 (ref 1.005–1.030)
pH: 6 (ref 5.0–8.0)

## 2023-05-28 LAB — PROTIME-INR
INR: 1.1 (ref 0.8–1.2)
Prothrombin Time: 14 s (ref 11.4–15.2)

## 2023-05-28 LAB — COMPREHENSIVE METABOLIC PANEL
ALT: 13 U/L (ref 0–44)
AST: 24 U/L (ref 15–41)
Albumin: 3.7 g/dL (ref 3.5–5.0)
Alkaline Phosphatase: 67 U/L (ref 38–126)
Anion gap: 10 (ref 5–15)
BUN: 24 mg/dL — ABNORMAL HIGH (ref 8–23)
CO2: 25 mmol/L (ref 22–32)
Calcium: 9 mg/dL (ref 8.9–10.3)
Chloride: 103 mmol/L (ref 98–111)
Creatinine, Ser: 1.31 mg/dL — ABNORMAL HIGH (ref 0.61–1.24)
GFR, Estimated: 51 mL/min — ABNORMAL LOW (ref >60)
Glucose, Bld: 102 mg/dL — ABNORMAL HIGH (ref 70–99)
Potassium: 4.4 mmol/L (ref 3.5–5.1)
Sodium: 138 mmol/L (ref 135–145)
Total Bilirubin: 0.5 mg/dL (ref <1.2)
Total Protein: 7.2 g/dL (ref 6.5–8.1)

## 2023-05-28 LAB — I-STAT CG4 LACTIC ACID, ED: Lactic Acid, Venous: 1.4 mmol/L (ref 0.5–1.9)

## 2023-05-28 LAB — T4, FREE: Free T4: 0.88 ng/dL (ref 0.61–1.12)

## 2023-05-28 LAB — TSH: TSH: 15.258 u[IU]/mL — ABNORMAL HIGH (ref 0.350–4.500)

## 2023-05-28 LAB — APTT: aPTT: 25 s (ref 24–36)

## 2023-05-28 MED ORDER — SODIUM CHLORIDE 0.9 % IV BOLUS
500.0000 mL | Freq: Once | INTRAVENOUS | Status: AC
  Administered 2023-05-28: 500 mL via INTRAVENOUS

## 2023-05-28 NOTE — ED Provider Notes (Addendum)
  Physical Exam  BP 124/85 (BP Location: Right Arm)   Pulse 66   Temp 98.9 F (37.2 C) (Oral)   Resp (!) 24   Ht 6' (1.829 m)   Wt 49 kg   SpO2 96%   BMI 14.65 kg/m   Physical Exam Vitals and nursing note reviewed.  HENT:     Head: Normocephalic and atraumatic.  Eyes:     Pupils: Pupils are equal, round, and reactive to light.  Cardiovascular:     Rate and Rhythm: Normal rate and regular rhythm.  Pulmonary:     Effort: Pulmonary effort is normal.     Breath sounds: Normal breath sounds.  Abdominal:     Palpations: Abdomen is soft.     Tenderness: There is no abdominal tenderness.  Skin:    General: Skin is warm and dry.  Neurological:     Mental Status: He is alert.  Psychiatric:        Mood and Affect: Mood normal.     Procedures  Procedures  ED Course / MDM   Clinical Course as of 05/28/23 2056  Tue May 28, 2023  2041 Patient has remained stable to this point.  No recurrence of hypotension.  Daughter states he is at his neurologic baseline.  CT abdomen pelvis without contrast has not been read yet.  I spoke with the daughter about this.  Since she feels he is at his neurologic baseline it would be in his best interest for him to go home.  She will follow-up on the results of the CT scan and make an appointment to see his urologist again.  Stable for discharge at this time [MP]  2055 CT read is back and I told her daughter about this.  No acute obstruction.  Some bladder wall thickening concerning for cystitis.  Daughter will follow-up with urology.  Additional chronic findings [MP]    Clinical Course User Index [MP] Royanne Foots, DO   Medical Decision Making I, Estelle June DO, have assumed care of this patient from the previous provider pending CT abdomen pelvis reevaluation and disposition  Amount and/or Complexity of Data Reviewed Labs: ordered. Radiology: ordered. ECG/medicine tests: ordered.   Final diagnosis  Hematuria Weakness Altered mental  status       Royanne Foots, DO 05/28/23 2052    Royanne Foots, DO 05/28/23 2056

## 2023-05-28 NOTE — ED Triage Notes (Addendum)
Pt arrived from  home and was brought in by [facility] due to syncope. A&0 X1 oriented to self . Pt family reported that this is lower then his baseline. Pt. Feels cool to touch.Pt. Family reports that pt is urinating more frequently and is not normally incontinent.

## 2023-05-28 NOTE — ED Provider Notes (Signed)
[place] EMERGENCY DEPARTMENT AT [facility] Provider Note   CSN: [id number] Arrival date & time: 05/28/23  1058     History  Chief Complaint  Patient presents with   Hypotension        Altered Mental Status    Ross Barron is a 89 y.o. male.  HPI Patient with dementia presents via EMS with family concerns of altered mental status.  Patient cannot provide any details of his history, level 5 caveat. Per EMS the patient's family noted that he was more confused than normal, and sent him here for evaluation.  EMS reports patient was mildly hypotensive en route, improved with fluid resuscitation provided by them.    Home Medications Prior to Admission medications   Medication Sig Start Date End Date Taking? Authorizing Provider  Cholecalciferol (VITAMIN D) 125 MCG (5000 UT) CAPS Take 10,000 Units by mouth once a week.   Yes [provider]  levothyroxine (SYNTHROID, LEVOTHROID) 50 MCG tablet Take 50 mcg by mouth daily before breakfast.   Yes [provider]  melatonin 3 MG TABS tablet Take 3 mg by mouth at bedtime.   Yes [provider]  memantine (NAMENDA) 10 MG tablet Take 10 mg by mouth 2 (two) times daily.   Yes [provider]  Multiple Vitamin (MULTIVITAMIN WITH MINERALS) TABS tablet Take 1 tablet by mouth daily.   Yes [provider]  rivastigmine (EXELON) 6 MG capsule Take 6 mg by mouth 2 (two) times daily.   Yes [provider]  acetaminophen (TYLENOL) 500 MG tablet Take 500 mg by mouth every 6 (six) hours as needed. Patient not taking: Reported on 05/28/2023    [provider]  guaiFENesin 200 MG tablet Take 200 mg by mouth 3 (three) times daily as needed for cough or to loosen phlegm. Patient not taking: Reported on 05/28/2023    [provider]  ibuprofen (ADVIL) 400 MG tablet Take 400 mg by mouth every 6 (six) hours as needed. Patient not taking: Reported on 05/28/2023     [provider]      Allergies    Patient has no known allergies.    Review of Systems   Review of Systems  Physical Exam Updated Vital Signs BP (!) 133/59   Pulse (!) 52   Temp (!) 97.5 F (36.4 C) (Oral)   Resp 14   Ht 6' (1.829 m)   Wt 49 kg   SpO2 100%   BMI 14.65 kg/m  Physical Exam Vitals and nursing note reviewed.  Constitutional:      General: He is not in acute distress.    Appearance: He is well-developed.  HENT:     Head: Normocephalic and atraumatic.  Eyes:     Conjunctiva/sclera: Conjunctivae normal.  Cardiovascular:     Rate and Rhythm: Normal rate and regular rhythm.  Pulmonary:     Effort: Pulmonary effort is normal. No respiratory distress.     Breath sounds: No stridor.  Abdominal:     General: There is no distension.  Skin:    General: Skin is warm and dry.  Neurological:     Mental Status: He is alert.     Comments: Stigmata of dementia.  Patient moves all extremity spontaneously, does not follow commands reliably.  Speech is inconsistent, but clear when offered.  Psychiatric:        Cognition and Memory: Cognition is impaired. Memory is impaired.     ED Results / Procedures / Treatments  Labs (all labs ordered are listed, but only abnormal results are displayed) Labs Reviewed  COMPREHENSIVE METABOLIC PANEL - Abnormal; Notable for the following components:      Result Value   Glucose, Bld 102 (*)    BUN 24 (*)    Creatinine, Ser 1.31 (*)    GFR, Estimated 51 (*)    All other components within normal limits  CBC WITH DIFFERENTIAL/PLATELET - Abnormal; Notable for the following components:   RBC 3.96 (*)    All other components within normal limits  URINALYSIS, W/ REFLEX TO CULTURE (INFECTION SUSPECTED) - Abnormal; Notable for the following components:   APPearance HAZY (*)    Hgb urine dipstick LARGE (*)    Bacteria, UA RARE (*)    All other components within normal limits  TSH - Abnormal; Notable for the following  components:   TSH 15.258 (*)    All other components within normal limits  CULTURE, BLOOD (ROUTINE X 2)  CULTURE, BLOOD (ROUTINE X 2)  PROTIME-INR  APTT  T4, FREE  I-STAT CG4 LACTIC ACID, ED  I-STAT CG4 LACTIC ACID, ED    EKG EKG Interpretation Date/Time:  Tuesday May 28 2023 11:05:13 EST Ventricular Rate:  56 PR Interval:  195 QRS Duration:  98 QT Interval:  481 QTC Calculation: 465 R Axis:   -4  Text Interpretation: Sinus rhythm Abnormal R-wave progression, early transition ST-t wave abnormality Baseline wander Artifact Abnormal ECG Confirmed by Gerhard Munch [phone]) on 05/28/2023 11:37:08 AM  Radiology CT Head Wo Contrast  Result Date: 05/28/2023 CLINICAL DATA:  Provided history: Mental status change, unknown cause. EXAM: CT HEAD WITHOUT CONTRAST TECHNIQUE: Contiguous axial images were obtained from the base of the skull through the vertex without intravenous contrast. RADIATION DOSE REDUCTION: This exam was performed according to the departmental dose-optimization program which includes automated exposure control, adjustment of the mA and/or kV according to patient size and/or use of iterative reconstruction technique. COMPARISON:  None. FINDINGS: Brain: Generalized cerebral atrophy. Moderate patchy and ill-defined hypoattenuation within the cerebral white matter, nonspecific but compatible with chronic small vessel disease. Minor infarct within the left caudate head. There is no acute intracranial hemorrhage. No demarcated cortical infarct. No extra-axial fluid collection. No evidence of an intracranial mass. No midline shift. Vascular: No hyperdense vessel.  Atherosclerotic calcifications. Skull: No calvarial fracture or aggressive osseous lesion. Sinuses/Orbits: No mass or acute finding within the imaged orbits. Trace mucosal thickening within the bilateral maxillary sinuses at the imaged levels. IMPRESSION: 1. No evidence of an acute intracranial abnormality. 2. Parenchymal  atrophy and chronic small vessel ischemic disease, including a chronic lacunar infarct within the left caudate head. Electronically Signed   By: Jackey Loge D.O.   On: 05/28/2023 12:24    Procedures Procedures    Medications Ordered in ED Medications  sodium chloride 0.9 % bolus 500 mL (has no administration in time range)  sodium chloride 0.9 % bolus 500 mL (0 mLs Intravenous Stopped 05/28/23 1323)    ED Course/ Medical Decision Making/ A&P                                 Medical Decision Making Elderly male with dementia presents from home with family concerns of confusion.  Patient is awake, alert, afebrile, but has mild tachypnea, and with EMS report of mild hypotension en route, differential including progression of disease versus infection versus dehydration versus bacteremia, or sepsis considered. Patient  placed on continuous monitoring, received fluid resuscitation. Cardiac 60 sinus normal Pulse ox 97% room air normal   Amount and/or Complexity of Data Reviewed Independent Historian:     Details: Daughter at bedside after initial evaluation External Data Reviewed: notes. Labs: ordered. Decision-making details documented in ED Course. Radiology: ordered and independent interpretation performed. Decision-making details documented in ED Course. ECG/medicine tests: ordered and independent interpretation performed. Decision-making details documented in ED Course.  Risk Prescription drug management. Decision regarding hospitalization. Diagnosis or treatment significantly limited by social determinants of health.   4:18 PM Now with daughter at bedside we have reviewed initial labs.  Patient's x-ray, CT abdomen pelvis, pending on signout.  Initial labs reassuring, with no lactic acidosis, urinalysis with blood, no obvious infection.  We discussed possibilities for the hematuria, including cystitis, malignancy, stone, prostate, and the patient's CT scan as above is  pending. Repeat evaluation, follow-up on imaging, labs for oncoming team.        Final Clinical Impression(s) / ED Diagnoses Final diagnoses:  Weakness    Rx / DC Orders ED Discharge Orders     None         Gerhard Munch, MD 05/28/23 1620

## 2023-05-28 NOTE — Discharge Instructions (Signed)
You were seen in the emergency department for confusion There was evidence of blood in your urine The rest of your lab work looked okay The CAT scan did not results yet and you will need to follow-up on the results of this test Please make an appointment to see your urologist to discuss further management of blood in the urine Also discussed medication management with your neurology team Return to the Emergency Department for confusion or any other concerns

## 2023-06-02 LAB — CULTURE, BLOOD (ROUTINE X 2)
Culture: NO GROWTH
Culture: NO GROWTH

## 2024-04-15 DEATH — deceased
